# Patient Record
Sex: Female | Born: 1970 | Race: White | Hispanic: No | Marital: Married | Smoking: Current every day smoker
Health system: Southern US, Community
[De-identification: ages and names within clinical notes are randomized; demographics above are authoritative.]

## PROBLEM LIST (undated history)

## (undated) DIAGNOSIS — K589 Irritable bowel syndrome without diarrhea: Secondary | ICD-10-CM

## (undated) DIAGNOSIS — E039 Hypothyroidism, unspecified: Secondary | ICD-10-CM

## (undated) DIAGNOSIS — L0591 Pilonidal cyst without abscess: Secondary | ICD-10-CM

## (undated) DIAGNOSIS — E7800 Pure hypercholesterolemia, unspecified: Secondary | ICD-10-CM

## (undated) DIAGNOSIS — E669 Obesity, unspecified: Secondary | ICD-10-CM

## (undated) DIAGNOSIS — R002 Palpitations: Secondary | ICD-10-CM

## (undated) HISTORY — DX: Palpitations: R00.2

## (undated) HISTORY — DX: Hypothyroidism, unspecified: E03.9

## (undated) HISTORY — DX: Pilonidal cyst without abscess: L05.91

## (undated) HISTORY — PX: MICRODISCECTOMY LUMBAR: SUR864

## (undated) HISTORY — DX: Irritable bowel syndrome, unspecified: K58.9

## (undated) HISTORY — DX: Obesity, unspecified: E66.9

## (undated) HISTORY — DX: Pure hypercholesterolemia, unspecified: E78.00

---

## 1998-03-23 ENCOUNTER — Ambulatory Visit (HOSPITAL_COMMUNITY): Admission: RE | Admit: 1998-03-23 | Discharge: 1998-03-23 | Payer: Self-pay | Admitting: Family Medicine

## 1998-05-08 ENCOUNTER — Observation Stay (HOSPITAL_COMMUNITY): Admission: AD | Admit: 1998-05-08 | Discharge: 1998-05-08 | Payer: Self-pay | Admitting: Obstetrics and Gynecology

## 1998-05-15 ENCOUNTER — Observation Stay (HOSPITAL_COMMUNITY): Admission: AD | Admit: 1998-05-15 | Discharge: 1998-05-15 | Payer: Self-pay | Admitting: Obstetrics and Gynecology

## 1998-11-07 ENCOUNTER — Inpatient Hospital Stay (HOSPITAL_COMMUNITY): Admission: AD | Admit: 1998-11-07 | Discharge: 1998-11-07 | Payer: Self-pay | Admitting: Obstetrics and Gynecology

## 1998-11-24 ENCOUNTER — Inpatient Hospital Stay (HOSPITAL_COMMUNITY): Admission: AD | Admit: 1998-11-24 | Discharge: 1998-11-28 | Payer: Self-pay | Admitting: Obstetrics & Gynecology

## 1999-01-03 ENCOUNTER — Other Ambulatory Visit: Admission: RE | Admit: 1999-01-03 | Discharge: 1999-01-03 | Payer: Self-pay | Admitting: Obstetrics and Gynecology

## 1999-12-26 ENCOUNTER — Other Ambulatory Visit: Admission: RE | Admit: 1999-12-26 | Discharge: 1999-12-26 | Payer: Self-pay | Admitting: Obstetrics and Gynecology

## 2000-06-27 ENCOUNTER — Encounter: Payer: Self-pay | Admitting: Obstetrics and Gynecology

## 2000-06-27 ENCOUNTER — Inpatient Hospital Stay (HOSPITAL_COMMUNITY): Admission: AD | Admit: 2000-06-27 | Discharge: 2000-06-30 | Payer: Self-pay | Admitting: Obstetrics and Gynecology

## 2000-06-28 ENCOUNTER — Encounter: Payer: Self-pay | Admitting: Obstetrics and Gynecology

## 2000-07-31 ENCOUNTER — Encounter: Payer: Self-pay | Admitting: Internal Medicine

## 2000-07-31 ENCOUNTER — Emergency Department (HOSPITAL_COMMUNITY): Admission: EM | Admit: 2000-07-31 | Discharge: 2000-07-31 | Payer: Self-pay | Admitting: Emergency Medicine

## 2000-08-28 ENCOUNTER — Other Ambulatory Visit: Admission: RE | Admit: 2000-08-28 | Discharge: 2000-08-28 | Payer: Self-pay | Admitting: Obstetrics and Gynecology

## 2001-07-13 ENCOUNTER — Other Ambulatory Visit: Admission: RE | Admit: 2001-07-13 | Discharge: 2001-07-13 | Payer: Self-pay | Admitting: Obstetrics and Gynecology

## 2001-09-04 ENCOUNTER — Inpatient Hospital Stay (HOSPITAL_COMMUNITY): Admission: AD | Admit: 2001-09-04 | Discharge: 2001-09-04 | Payer: Self-pay | Admitting: *Deleted

## 2001-09-04 ENCOUNTER — Encounter: Payer: Self-pay | Admitting: Obstetrics and Gynecology

## 2002-01-27 ENCOUNTER — Inpatient Hospital Stay (HOSPITAL_COMMUNITY): Admission: AD | Admit: 2002-01-27 | Discharge: 2002-01-30 | Payer: Self-pay | Admitting: Obstetrics and Gynecology

## 2002-03-15 ENCOUNTER — Other Ambulatory Visit: Admission: RE | Admit: 2002-03-15 | Discharge: 2002-03-15 | Payer: Self-pay | Admitting: Obstetrics and Gynecology

## 2002-08-31 ENCOUNTER — Encounter: Admission: RE | Admit: 2002-08-31 | Discharge: 2002-08-31 | Payer: Self-pay | Admitting: Rheumatology

## 2002-08-31 ENCOUNTER — Encounter: Payer: Self-pay | Admitting: Rheumatology

## 2003-03-22 ENCOUNTER — Other Ambulatory Visit: Admission: RE | Admit: 2003-03-22 | Discharge: 2003-03-22 | Payer: Self-pay | Admitting: Obstetrics and Gynecology

## 2004-08-01 ENCOUNTER — Other Ambulatory Visit: Admission: RE | Admit: 2004-08-01 | Discharge: 2004-08-01 | Payer: Self-pay | Admitting: Obstetrics and Gynecology

## 2008-10-20 ENCOUNTER — Emergency Department (HOSPITAL_COMMUNITY): Admission: EM | Admit: 2008-10-20 | Discharge: 2008-10-20 | Payer: Self-pay | Admitting: Emergency Medicine

## 2009-01-16 ENCOUNTER — Emergency Department (HOSPITAL_COMMUNITY): Admission: EM | Admit: 2009-01-16 | Discharge: 2009-01-16 | Payer: Self-pay | Admitting: Emergency Medicine

## 2009-01-16 ENCOUNTER — Ambulatory Visit: Payer: Self-pay | Admitting: Internal Medicine

## 2009-02-02 ENCOUNTER — Ambulatory Visit: Payer: Self-pay

## 2009-02-02 ENCOUNTER — Encounter: Payer: Self-pay | Admitting: Internal Medicine

## 2009-02-08 DIAGNOSIS — R002 Palpitations: Secondary | ICD-10-CM

## 2009-02-08 DIAGNOSIS — F17200 Nicotine dependence, unspecified, uncomplicated: Secondary | ICD-10-CM

## 2009-02-09 ENCOUNTER — Ambulatory Visit: Payer: Self-pay | Admitting: Internal Medicine

## 2009-02-15 ENCOUNTER — Telehealth: Payer: Self-pay | Admitting: Internal Medicine

## 2011-01-09 LAB — POCT PREGNANCY, URINE: Preg Test, Ur: NEGATIVE

## 2011-01-09 LAB — CBC
HCT: 46.8 % — ABNORMAL HIGH (ref 36.0–46.0)
Hemoglobin: 16.1 g/dL — ABNORMAL HIGH (ref 12.0–15.0)
MCHC: 34.3 g/dL (ref 30.0–36.0)
MCV: 90.7 fL (ref 78.0–100.0)
RBC: 5.16 MIL/uL — ABNORMAL HIGH (ref 3.87–5.11)
WBC: 12.8 10*3/uL — ABNORMAL HIGH (ref 4.0–10.5)

## 2011-01-09 LAB — URINALYSIS, ROUTINE W REFLEX MICROSCOPIC
Glucose, UA: NEGATIVE mg/dL
Ketones, ur: 15 mg/dL — AB
Nitrite: NEGATIVE
Protein, ur: NEGATIVE mg/dL
pH: 6 (ref 5.0–8.0)

## 2011-01-09 LAB — DIFFERENTIAL
Basophils Absolute: 0.2 10*3/uL — ABNORMAL HIGH (ref 0.0–0.1)
Eosinophils Relative: 1 % (ref 0–5)
Lymphocytes Relative: 30 % (ref 12–46)
Lymphs Abs: 3.9 10*3/uL (ref 0.7–4.0)
Neutrophils Relative %: 63 % (ref 43–77)

## 2011-01-09 LAB — COMPREHENSIVE METABOLIC PANEL
BUN: 11 mg/dL (ref 6–23)
CO2: 23 mEq/L (ref 19–32)
Calcium: 9.4 mg/dL (ref 8.4–10.5)
Chloride: 104 mEq/L (ref 96–112)
Creatinine, Ser: 0.8 mg/dL (ref 0.4–1.2)
GFR calc Af Amer: >60 mL/min (ref >60)
GFR calc non Af Amer: >60 mL/min (ref >60)
Glucose, Bld: 120 mg/dL — ABNORMAL HIGH (ref 70–99)
Total Bilirubin: 1 mg/dL (ref 0.3–1.2)

## 2011-01-09 LAB — POCT CARDIAC MARKERS
CKMB, poc: <1 ng/mL — ABNORMAL LOW (ref 1.0–8.0)
Myoglobin, poc: 42.2 ng/mL (ref 12–200)

## 2011-02-12 NOTE — Consult Note (Signed)
NAMEALYSIA, Rebekah Flores NO.:  [id number]   MEDICAL RECORD NO.:  [id number]          Rebekah Flores TYPE:  EMS   LOCATION:  ED                           FACILITY:  [facility]   PHYSICIAN:  Pricilla Riffle, MD, FACCDATE OF BIRTH:  1971/05/26   DATE OF CONSULTATION:  DATE OF DISCHARGE:  01/16/2009                                 CONSULTATION   PRIMARY CARDIOLOGISTS:  New--Dr. Gunnar Fusi Ross/EP Dr. Lewayne Bunting   PRIMARY CARE PHYSICIAN:  None.   OBSTETRICS/GYNECOLOGY:  Dr. Candice Camp   Rebekah Flores is a 40 year old Caucasian female with no known history of  coronary artery disease who presented to [facility] Emergency Room this  admission complaining of palpitations and rapid heartbeat.  Rebekah Flores  states this has happened on 3 other occasions.  Note:  Those 3 occasions  all occurred during normal pregnancies.  She had 1 syncopal episode  associated with the palpitations about 4 years ago.  The other 3 times,  she felt presyncopal but did not have a frank syncopal episode.  On this  admission, Rebekah Flores states she was in her usual state of health, went  to bed last night without problems.  Around 4:00 a.m., she woke up.  She  felt her heart was racing.  She got up to urinate.  She said she felt  very faint, returned to bed.  She was restless, unable to sleep.  Heart  continued to beat extremely fast.  She then became short of breath and  diaphoretic and states she felt like she was going to pass out again.  So, she decided to come in and get evaluated.  In the ER, 12-lead EKG  showed SVT at a rate of 149.  ER physician attempted a vagal maneuver  which was successful, and Rebekah Flores converted to a normal sinus rhythm and  has maintained sinus rhythm on the monitor while here in the emergency  room.  Urine pregnancy screen has also been obtained that was negative.  Rebekah Flores denies any increased caffeine intake or decongestant use.  She  essentially has no past medical history other than  the palpitations and  rapid heartbeat that also have occurred 3 previous times while pregnant.  She states the only person she has ever seen is her OB/GYN.   PAST MEDICAL HISTORY:  Palpitations, tobacco abuse.  Rebekah Flores has been  smoking for about 6 years.  She states she quit prior to that but just  picked the habit back up.   SOCIAL HISTORY:  She lives in [place] with her spouse.  She is a  [employer].  They have 3 children, all healthy.  She smokes 1/2-  pack of cigarettes a day.  No diet restrictions.  No drug, herbal  medication or alcohol use.  No routine exercise.   FAMILY HISTORY:  Parents are alive and well.  Rebekah Flores thinks her father  has been diagnosed with an arrhythmia, but details are unavailable.  She  has siblings alive and well.   REVIEW OF SYSTEMS:  Positive for sweats, shortness of breath,  palpitations, presyncopal episode, all  described in HPI.  All other  systems were reviewed and are negative.   ALLERGIES:  No known drug allergies.   MEDICINES:  No routine prescribed or over-the-counter medicines.   PHYSICAL EXAMINATION:  VITAL SIGNS:  Temp 97.8, heart rate 80,  respirations 16, blood pressure 117/69, saturating 100% on room air.  GENERALLY:  In no acute distress.  HEENT:  Unremarkable.  NECK:  Supple without lymphadenopathy, bruits or JVD.  CARDIOVASCULAR:  Reveals S1, S2, regular rate and rhythm.  I do not hear  any murmurs, rubs, or gallops.  LUNGS:  Clear to auscultation bilaterally.  ABDOMEN:  Soft, nontender, positive bowel sounds.  LOWER EXTREMITIES:  Without clubbing, cyanosis or edema.  NEUROLOGICAL:  Alert and oriented x3.   Chest x-ray showed no acute findings.  EKG:  Currently sinus rhythm at a  rate of 86, previously sinus tachycardia at a rate of 149 without acute  ST or T-wave changes.  Point-of-care is negative x1.  Hemoglobin and  hematocrit 16.1 and 46.8 respectively, potassium 4.1, creatinine 0.8.   IMPRESSION:  1.  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  2. Tobacco abuse.   Dr. Dietrich Pates has been in to examine and assess Rebekah Flores.  Rebekah Flores has  been educated on the symptoms associated with SVT and precautions to  take in regards to syncope.  I scheduled her to follow up with a 2-D  echocardiogram January 24, 2009, at 4:00 p.m. and then an evaluation with  Dr. Ladona Ridgel Feb 09, 2009, at 3:30 p.m.  Rebekah Flores is interested in pursuing  an ablation.  We will check a TSH here prior to leaving the ER.  Otherwise, follow up outpatient as instructed.      Dorian Pod, ACNP      Pricilla Riffle, MD, [facility]  Electronically Signed    MB/MEDQ  D:  01/16/2009  T:  01/16/2009  Job:  [id number]

## 2011-02-15 NOTE — H&P (Signed)
[facility]  Patient:    Rebekah Flores, Rebekah Flores                  MRN: [id number] Adm. Date:  [id number] Attending:  Frederich Balding                         History and Physical  HISTORY:                      The patient is a 40 year old gravida 2, para 1, married white female with estimated date of confinement of July 09, 2000, giving her estimated gestational age of 38-1/2 weeks.  This is consistent with an ultrasound evaluation.  Her prenatal course has been complicated by a prior low transverse cesarean section with failure to progress.  She was scheduled for a repeat cesarean section next Friday.  She presented to triage complaining of the onset of a rapid heart rate and dizziness since this evening.  Of note, she had a similar episode with her last pregnancy that was never followed up after delivery.  On initial evaluation in triage, the patient was in a supraventricular tachycardia with a rate of 169.  She subsequently did convert spontaneously to a normal sinus rhythm.  An arterial blood gas was obtained with a PO2 on room air of 67.8.  Subsequent chest x-ray was read out as normal.  A cardiology consultation was obtained.  An echocardiogram was performed which did reveal some hypokinesia of the left ventricular wall.  There was some concern about this finding.  Cardiac enzymes were obtained and are still pending at the present time.  It was the recommendation that possibly proceeding with delivery at this time was in order, in order that further evaluation of the patients cardiac status could be undertaken.  In view of this, we are going to proceed with a repeat cesarean section at this point in time.  ALLERGIES:                    No known drug allergies.  CURRENT MEDICATIONS:          Prenatal vitamins.  PRENATAL LABORATORY DATA:     The patient is A-positive, negative antibody screen, nonreactive serology.  She is immune to rubella.   Negative hepatitis-B surface antigen.  She does have a positive group-B Streptococcus with the prior pregnancy.  Her 50 g glucola was 90.  PAST MEDICAL HISTORY/FAMILY HISTORY/SOCIAL HISTORY:  Please see the prenatal records.  REVIEW OF SYSTEMS:            Noncontributory.  PHYSICAL EXAMINATION:  VITAL SIGNS:                  The patient is afebrile with stable vital signs.  HEENT:                        Normocephalic.  Pupils equal, round, reactive to light and accommodation.  Extraocular movements intact.  Sclerae and conjunctivae clear.  Oropharynx clear.  NECK:                         Without thyromegaly.  BREASTS:                      Were not examined.  LUNGS:  Clear at this point in time.  CARDIOVASCULAR:               A regular rhythm and rate.  There was grade 2/6 systolic ejection murmur.  No clicks or gallops at this point.  She was in a normal sinus rhythm.  ABDOMEN:                      Gravid uterus consistent with dates.  Fetal heart tones audible.  Fetal monitoring revealed a reactive tracing, no decelerations.  PELVIC:                       Not performed at this point in time.  NEUROLOGIC:                   Deep tendon reflexes 2+ with no clonus.  EXTREMITIES:                  She had a trace of edema.  IMPRESSION:                   1. Intrauterine pregnancy at 38-1/2 weeks with                                  prior cesarean section, desires repeat.                               2. Episode of supraventricular tachycardia of                                  undetermined etiology.                               3. Arterial blood gas indicating mild hypoxia                                  of undetermined etiology.                               4. Echocardiogram suggestive of hypokinesia                                  of the left ventricular wall.  PLAN:                         In view of these findings, the patient will proceed with  a repeat cesarean section at this point in time.  The risks were discussed, including the risks of infection, the risks of hemorrhage that could necessitate transfusion, the risks of AIDS or hepatitis.  The risks of injury to adjacent organs, including bladder, bowel, or ureters, that could require further exploratory surgery, the risks of deep vein thrombosis and pulmonary embolus.  The patient expressed her understanding of the indications and risks.  The patients group-B Streptococcus was positive.  Because her membranes are not ruptured and she is not in active labor, will not treat empirically preoperatively. DD:  06/27/00 TD:  06/27/00 Job:  [id number]

## 2011-02-15 NOTE — Op Note (Signed)
[facility]  Patient:    Rebekah Flores, Rebekah Flores                        MRN: [id number] Proc. Date: 06/27/00 Attending:  Juluis Mire, M.D.                           Operative Report  PREOPERATIVE DIAGNOSES:         1. Intrauterine pregnancy at 38+ weeks.                                 2. Prior cesarean section; desires repeat.                                 3. Maternal supraventricular tachycardia and                                    hypoxia, possible cardiac origin.  POSTOPERATIVE DIAGNOSES:        1. Intrauterine pregnancy at 38+ weeks.                                 2. Prior cesarean section; desires repeat.                                 3. Maternal supraventricular tachycardia and                                    hypoxia, possible cardiac origin. OPERATION:                      Repeat low transverse cesarean section.  SURGEON:                        Juluis Mire, M.D.  ANESTHESIA:                     Spinal.  ESTIMATED BLOOD LOSS:           800 cc.  PACKS AND DRAINS:               None.  INTRAOPERATIVE BLOOD PLACED:    None.  COMPLICATIONS:                  None.  INDICATIONS:                    Indications are dictated in the history and physical.  DESCRIPTION OF PROCEDURE:       The patient was taken to the operating room and placed in the supine position with left lateral tilt.  After a satisfactory level of spinal anesthesia was obtained, the abdomen was then prepped out with Betadine and draped in a sterile field.  The prior low transverse incision was identified and incised.  The incision was then extended to the subcutaneous tissue.  The anterior rectus fascia was entered sharply and incision in the fascia was extended laterally.  The fascia was taken off the  muscle superiorly and inferiorly using both blunt and sharp dissection.  Rectus muscles were then separated in the midline.  The anterior peritoneum was identified and  entered sharply.  The incision in the peritoneum was extended both superiorly and inferiorly.  The low transverse bladder flap was developed.  The low transverse uterine incision was begun with the knife and extended using manual traction.  The infant presented in the vertex presentation and was delivered with the elevation of the head and fundal pressure.  The infant was a live female who weighed 7 pounds ounces.  Apgars were 8 and 9.  Umbilical cord pH was 7.29.  The placenta was then delivered manually.  The uterus was wiped free of remaining membranes and placenta.  The uterus was then closed with a running locking suture of 0 chromic using a two layer closure technique.  We had excellent hemostasis.  Urine output was clear and adequate.  Tubes and ovaries were unremarkable.  At this point in time, the muscles were reapproximated with running suture of 3-0 Vicryl.  The fascia was closed with a running suture of 0 PDS.  The subcutaneous tissue was closed with a running suture of 3-0 Vicryl.  The skin was closed with staples and Steri-Strips.  Sponge, instrument and needle counts reported as correct by the circulating nurse x 2.  Foley catheter remained clear at the time of closure. The patient tolerated the procedure well and returned to the recovery room in good condition. DD:  06/27/00 TD:  06/28/00 Job: [id number]

## 2011-02-15 NOTE — Discharge Summary (Signed)
[facility]  Patient:    Rebekah Flores, Rebekah Flores                  MRN: [id number] Adm. Date:  [id number] Disc. Date: [id number] Attending:  Frederich Balding Dictator:   Danie Chandler, R.N.                           Discharge Summary  ADMISSION DIAGNOSES:          1. Intrauterine pregnancy at 38+ weeks                                  gestation.                               2. Prior cesarean section, desires repeat.                               3. Maternal supraventricular tachycardia                                  and hypoxia, possible cardiac origin.  DISCHARGE DIAGNOSES:          1. Intrauterine pregnancy at 38+ weeks                                  gestation.                               2. Prior cesarean section, desires repeat.                               3. Maternal supraventricular tachycardia                                  and hypoxia, possible cardiac origin.  PROCEDURE:                    On June 27, 2000, a repeat low transverse cesarean section.  REASON FOR ADMISSION:         See the dictated H&P.  HOSPITAL COURSE:              The patient was taken to the operating room and underwent the above-named procedure without complications.  This was productive of a viable female infant with Apgars of 8 at one minute and 9 at five minutes.  An arterial cord pH of 7.29.  Postoperatively the patient did well.  On postoperative day number one the patients vital signs were stable. Her lungs were clear.  Her spiral CT was negative.  Her hemoglobin was 9.5. She was doing well. On postoperative day number two, the patient had a good return of bowel function and was tolerating a regular diet.  She was also ambulating well without difficulty and had good pain control.  DISPOSITION:                  She was discharged home on postoperative day  number three.  CONDITION ON DISCHARGE:       Good.  DIET:                         Regular as  tolerated.  ACTIVITIES:                   No heavy lifting, no driving, no vaginal entry.  FOLLOWUP:                     She is to follow up in the office on Friday for staple removal.  She is to call for a temperature greater than 100 degrees, persistent nausea, vomiting, heavy vaginal bleeding and/or redness or drainage from the incision site.  She is to follow up with her cardiologist, Dr. Luis Abed.  DISCHARGE MEDICATIONS:        1. Prenatal vitamins one p.o. q.d.                               2. Tylox #30, one to two p.o. q.3-4h.                                  p.r.n. pain.                               3. Ibuprofen 600 mg p.o. q.6h. p.r.n. pain. DD:  07/23/00 TD:  07/23/00 Job: [id number]

## 2011-02-15 NOTE — Op Note (Signed)
[facility] of [facility]  Patient:    Rebekah Flores, Rebekah Flores Visit Number: [id number] MRN: [id number]          Service Type: OBS Location: 910A 9132 01 Attending Physician:  Trevor Iha Dictated by:   Trevor Iha, M.D. Proc. Date: 01/27/02 Admit Date:  01/27/2002                             Operative Report  PREOPERATIVE DIAGNOSES:       1. Previous cesarean section x2.                               2. Desire for repeat cesarean section.                               3. Intrauterine pregnancy at 39 weeks.  POSTOPERATIVE DIAGNOSES:      1. Previous cesarean section x2.                               2. Desire for repeat cesarean section.                               3. Intrauterine pregnancy at 39 weeks.  PROCEDURE:                    Repeat low segment transverse cesarean section  SURGEON:                      Trevor Iha, M.D.  ASSISTANT:                    Guy Sandifer. Arleta Creek, M.D.  ANESTHESIA:                   Spinal.  ESTIMATED BLOOD LOSS:         800 cc.  INDICATIONS:                  The patient is a 40 year old G3, P2 at 39 weeks with an uncomplicated pregnancy and two previous cesarean sections for CPD. She desire repeat cesarean section.  Estimated date of confinement is Feb 04, 2002.  She presents today for repeat cesarean section.  The risks and benefits were discussed and informed consent was obtained.  FINDINGS:                     Viable female infant.  Apgars 9 and 9.  Weight 8 pounds 4 ounces.  Arterial pH 7.28.  DESCRIPTION OF PROCEDURE:     After adequate analgesia, the patient was placed in the supine position with a left lateral tilt.  She was sterilely prepped and draped.  A Foley catheter was sterilely placed.  A Pfannenstiel skin incision was made two fingerbreadths above the pubic symphysis.  This was taken down sharply.  The fascia was incised transversely and extended superiorly and inferior off the bellies of  the rectus muscles, which were separated sharply in the midline.  The peritoneum was entered sharply.  A bladder blade was placed.  The uterine serosa was elevated, nicked and incised transversely.  A bladder flap was created and replaced behind the bladder  blade.  A low segment myotomy incision was made down to the infants vertex, which was delivered atraumatically.  The nares and pharynx were then suctioned.  The remainder of the infant was then delivered with good cry noted.  The cord was clamped and cut and the infant was handed to the pediatricians for resuscitation.  Cord blood was then obtained.  The placenta was extracted manually.  The uterus was exteriorized and wiped clean with a dry lap.  The myotomy incision was closed in two layers, the first being a running locking layer of 0 Monocryl, the second being an imbricating layer of 0 Monocryl.  Normal uterus, tubes and ovaries were noted.  The uterus was placed back in the peritoneal cavity and, after a copious amount of irrigation, adequate hemostasis was assured.  The peritoneum was closed with 0 Monocryl.  The rectus muscles were plicated in the midline.  Irrigation was applied.  After adequate hemostasis, the fascia was closed in a single layer of 0 PDS in a running fashion.  Irrigation was applied.  After adequate hemostasis, the skin was stapled and Steri-Strips applied.  The patient tolerated the procedure well and was stable on transfer to the recovery room. Sponge, needle and instrument counts was normal x3.  The patient received 1 g of cefotetan after delivery of the placenta. Dictated by:   Trevor Iha, M.D. Attending Physician:  Trevor Iha DD:  01/27/02 TD:  01/27/02 Job: [id number]

## 2011-02-15 NOTE — Discharge Summary (Signed)
[facility]  Patient:    Rebekah Flores, Rebekah Flores Visit Number: [id number] MRN: [id number]          Service Type: OBS Location: 910A 9132 01 Attending Physician:  Trevor Iha Dictated by:   Julio Sicks, N.P. Admit Date:  01/27/2002 Discharge Date: 01/30/2002                             Discharge Summary  ADMISSION DIAGNOSES:          1. Intrauterine pregnancy at 39 weeks.                               2. Previous cesarean delivery x2.                               3. Desires repeat cesarean delivery.  DISCHARGE DIAGNOSES:          1. Low transverse cesarean section.                               2. Viable female infant.  PROCEDURE:                    Repeat low transverse cesarean section.  REASON FOR ADMISSION:         Please see dictated H&P.  HOSPITAL COURSE:              The patient was admitted for a repeat cesarean delivery.  The patient was taken to the operating room for the above named procedure.  A low transverse incision was made without complications and the delivery of a viable female infant weighing 8 pounds 4 ounces with Apgars of 9 at one minute and 9 at five minutes.  Arterial cord pH was 7.28.  The patient tolerated the procedure well and was taken to the recovery room.  On postoperative day #1, the patient had good return of bowel function.  Abdomen was soft, fundus was firm.  Abdominal dressing was clean, dry, and intact. Foley was discontinued and the patient was voiding without difficulty.  Labs revealed a hemoglobin of 8.7, hematocrit 25.8, WBC count of 7.8.  On postoperative day #2, incision was clean, dry, and intact.  The patient was ambulating without difficulty.  She was tolerating a regular diet without complaints of nausea and vomiting.  On postoperative day #3, the patient was doing well.  Staples were removed and the patient was discharged home.  CONDITION ON DISCHARGE:       Good.  DIET:                          Regular as tolerated.  ACTIVITY:                     No heavy lifting, no driving x2 weeks, no vaginal entry.  FOLLOW-UP:                    The patient is to follow up in the office in one to two weeks for an incision check.  She is to call for a temperature greater than 100 degrees, persistent nausea and vomiting, heavy vaginal bleeding, and/or redness or drainage from the incision site.  DISCHARGE  MEDICATIONS:        1. Percocet 5/325 one every four to six hours                                  p.r.n. pain.                               2. Ibuprofen 600 mg over-the-counter every six                                  hours.                               3. Niferex 150 mg one p.o. daily.                               4. Prenatal vitamins one p.o. daily. Dictated by:   Julio Sicks, N.P. Attending Physician:  Trevor Iha DD:  02/16/02 TD:  02/17/02 Job: [id number]

## 2011-02-15 NOTE — H&P (Signed)
[facility]  Patient:    Rebekah Flores, Rebekah Flores Visit Number: [id number] MRN: [id number]          Service Type: Attending:  Trevor Iha, M.D. Dictated by:   Trevor Iha, M.D. Adm. Date:  01/27/02                           History and Physical  HISTORY OF PRESENT ILLNESS:   Ms. Warzecha is a 40 year old, G3, P2, at 74 weeks who presents for repeat cesarean section.  Estimated date of confinement is Feb 04, 2002.  She has had two previous cesarean sections with desired repeat.  Her pregnancy has been uncomplicated.  Her blood type is A positive.  HISTORY:                      The patient has a history of paternal supraventricular tachycardia, remote history of anxiety disorder.  PAST SURGICAL HISTORY:        She has had two cesarean sections and also back surgery.  PAST OBSTETRICAL HISTORY:     Again, she has had two cesarean sections, the first for pelvic disproportion; the second was repeat.  MEDICATIONS:                  Prenatal vitamins.  ALLERGIES:                    No known drug allergies.  PHYSICAL EXAMINATION:  VITAL SIGNS:                  Blood pressure 120/70.  HEART:                        Regular rate and rhythm.  LUNGS:                        Clear to auscultation bilaterally.  ABDOMEN:                      Gravid, nontender.  PELVIC:                       Cervix closed, thick, and high.  IMPRESSION:                   1. Intrauterine pregnancy at 39 weeks.                               2. Previous cesarean section x 2.                               3. Desire for repeat cesarean section.  PLAN:                         Repeat low transverse cesarean section.  Risks and benefits were discussed at length, and informed consent was obtained. Dictated by:   Trevor Iha, M.D. Attending:  Trevor Iha, M.D. DD:  01/26/02 TD:  01/26/02 Job: [id number]

## 2012-07-01 ENCOUNTER — Other Ambulatory Visit: Payer: Self-pay | Admitting: Obstetrics and Gynecology

## 2012-07-01 DIAGNOSIS — R928 Other abnormal and inconclusive findings on diagnostic imaging of breast: Secondary | ICD-10-CM

## 2012-07-01 DIAGNOSIS — N6489 Other specified disorders of breast: Secondary | ICD-10-CM

## 2012-07-06 ENCOUNTER — Ambulatory Visit
Admission: RE | Admit: 2012-07-06 | Discharge: 2012-07-06 | Disposition: A | Discharge status: Home / Self Care | Payer: Commercial Indemnity | Source: Ambulatory Visit | Attending: Obstetrics and Gynecology | Admitting: Obstetrics and Gynecology

## 2012-07-06 DIAGNOSIS — R928 Other abnormal and inconclusive findings on diagnostic imaging of breast: Secondary | ICD-10-CM

## 2013-08-01 ENCOUNTER — Encounter (HOSPITAL_COMMUNITY): Payer: Self-pay | Admitting: Emergency Medicine

## 2013-08-01 ENCOUNTER — Emergency Department (HOSPITAL_COMMUNITY)
Admission: EM | Admit: 2013-08-01 | Discharge: 2013-08-01 | Disposition: A | Discharge status: Home / Self Care | Payer: Managed Care, Other (non HMO) | Attending: Emergency Medicine | Admitting: Emergency Medicine

## 2013-08-01 DIAGNOSIS — M5416 Radiculopathy, lumbar region: Secondary | ICD-10-CM

## 2013-08-01 DIAGNOSIS — R209 Unspecified disturbances of skin sensation: Secondary | ICD-10-CM | POA: Insufficient documentation

## 2013-08-01 DIAGNOSIS — IMO0002 Reserved for concepts with insufficient information to code with codable children: Secondary | ICD-10-CM | POA: Insufficient documentation

## 2013-08-01 MED ORDER — HYDROCODONE-ACETAMINOPHEN 5-325 MG PO TABS: 2.0000 | ORAL_TABLET | ORAL | Status: DC | PRN

## 2013-08-01 NOTE — ED Notes (Signed)
Unsure why pt has not been triaged out of the system. Pt was moved off the floor 0930

## 2013-08-01 NOTE — ED Notes (Signed)
Pt from home reports that she has hx of back pain and back surgery that worsened yesterday. Pt states that she has numbness to L leg. Pt is A&O and in NAD

## 2013-08-01 NOTE — ED Provider Notes (Signed)
CSN: [id number]     Arrival date & time 08/01/13  9562 History   None    No chief complaint on file.  (Consider location/radiation/quality/duration/timing/severity/associated sxs/prior Treatment) HPI Complains of low back pain radiating to left foot onset yesterday when she bent over to pick up an object inside the trunk of her car. Pain is worse with sitting improved with standing. No loss of bladder or bowel control. No fever. Associated symptoms include some numbness in her left foot No other complaint. Treated with ibuprofen without adequate pain relief. Pain moderate at present. No past medical history on file. past medical history negative No past surgical history on file. No family history on file. surgical history microdiscectomy of lumbar spine History  Substance Use Topics  . Smoking status: Not on file  . Smokeless tobacco: Not on file  . Alcohol Use: Not on file   social history positive smoker no alcohol no drug use  OB History   No data available     Review of Systems  Constitutional: Negative.   HENT: Negative.   Respiratory: Negative.   Cardiovascular: Negative.   Gastrointestinal: Negative.   Musculoskeletal: Positive for back pain.  Skin: Negative.   Neurological: Positive for numbness.  Psychiatric/Behavioral: Negative.   All other systems reviewed and are negative.    Allergies  Review of patient's allergies indicates not on file.  Home Medications  No current outpatient prescriptions on file. BP 122/76  Pulse 102  Temp(Src) 97.7 F (36.5 C) (Oral)  Resp 16  SpO2 100% Physical Exam  Nursing note and vitals reviewed. Constitutional: She appears well-developed and well-nourished.  HENT:  Head: Normocephalic and atraumatic.  Eyes: Conjunctivae are normal. Pupils are equal, round, and reactive to light.  Neck: Neck supple. No tracheal deviation present. No thyromegaly present.  Cardiovascular: Regular rhythm.   Pulmonary/Chest: Effort normal and  breath sounds normal.  Abdominal: Soft. Bowel sounds are normal. She exhibits no distension. There is no tenderness.  Musculoskeletal: Normal range of motion. She exhibits no edema and no tenderness.  Mild left paralumbar tenderness thoracic spine is nontender. No swelling. Pain is exacerbated when she flexes at the waist.  Neurological: She is alert. She has normal reflexes. Coordination normal.  Gait is normal. There dorsiflex left great toe without difficulty. DTRs symmetric bilaterally at knee jerk and ankle jerk bilaterally motor strength 5 over 5 overall  Skin: Skin is warm and dry. No rash noted.  Psychiatric: She has a normal mood and affect.    ED Course  Procedures (including critical care time) Labs Review Labs Reviewed - No data to display Imaging Review No results found.  EKG Interpretation   None       MDM  No diagnosis found. Acute imaging is not indicated. Patient agreesPlan prescription Norco. opiodpain medicine not givenis driving. Patient to followup with Dr. Rana Snare if significant pain one week Diagnosis lumbar radiculopathy    Doug Sou, MD 08/01/13 [phone]

## 2015-12-17 ENCOUNTER — Emergency Department (HOSPITAL_COMMUNITY)
Admission: EM | Admit: 2015-12-17 | Discharge: 2015-12-17 | Disposition: A | Discharge status: Home / Self Care | Payer: Managed Care, Other (non HMO) | Attending: Emergency Medicine | Admitting: Emergency Medicine

## 2015-12-17 ENCOUNTER — Encounter (HOSPITAL_COMMUNITY): Payer: Self-pay | Admitting: *Deleted

## 2015-12-17 ENCOUNTER — Emergency Department (HOSPITAL_COMMUNITY): Payer: Managed Care, Other (non HMO)

## 2015-12-17 DIAGNOSIS — J029 Acute pharyngitis, unspecified: Secondary | ICD-10-CM | POA: Diagnosis not present

## 2015-12-17 DIAGNOSIS — R5383 Other fatigue: Secondary | ICD-10-CM | POA: Diagnosis not present

## 2015-12-17 DIAGNOSIS — M791 Myalgia: Secondary | ICD-10-CM | POA: Insufficient documentation

## 2015-12-17 DIAGNOSIS — R0981 Nasal congestion: Secondary | ICD-10-CM | POA: Diagnosis not present

## 2015-12-17 DIAGNOSIS — R059 Cough, unspecified: Secondary | ICD-10-CM

## 2015-12-17 DIAGNOSIS — R509 Fever, unspecified: Secondary | ICD-10-CM | POA: Insufficient documentation

## 2015-12-17 DIAGNOSIS — R05 Cough: Secondary | ICD-10-CM | POA: Diagnosis not present

## 2015-12-17 DIAGNOSIS — R0989 Other specified symptoms and signs involving the circulatory and respiratory systems: Secondary | ICD-10-CM | POA: Insufficient documentation

## 2015-12-17 DIAGNOSIS — R110 Nausea: Secondary | ICD-10-CM | POA: Diagnosis not present

## 2015-12-17 MED ORDER — BENZONATATE 100 MG PO CAPS: 200.0000 mg | ORAL_CAPSULE | Freq: Two times a day (BID) | ORAL | Status: DC | PRN

## 2015-12-17 MED ORDER — OXYMETAZOLINE HCL 0.05 % NA SOLN: 1.0000 | Freq: Two times a day (BID) | NASAL | Status: DC

## 2015-12-17 NOTE — ED Notes (Signed)
Pt states that she has had fever, body aches, chills and generalized malaise since Wednesday; pt states that she has had nausea with no vomiting; pt c/o congestion and states "I think I am wheezing" at times; pt states that she has been taking tylenol and ibuprofen for the fever and body aches and that it has helped; pt states "I think I have the flu, my husband had it last week"

## 2015-12-17 NOTE — ED Provider Notes (Signed)
CSN: [id number]     Arrival date & time 12/17/15  0555 History   First MD Initiated Contact with Patient 12/17/15 [phone]     Chief Complaint  Patient presents with  . Flu Like Symptoms      (Consider location/radiation/quality/duration/timing/severity/associated sxs/prior Treatment) HPI   Patient is a 45 year old female with no pertinent past medical history who presents to the ED with complaint of flulike symptoms, onset 5 days. Patient reports having fever (99-100.5), chills, body aches, fatigue, nasal congestion, sore throat, nonproductive cough, chest congestion and mild nausea. Denies headache, rhinorrhea, shortness of breath, wheezing, chest pain, abdominal pain, nausea, vomiting, diarrhea, urinary symptoms, rash. She notes she has been taking ibuprofen at home intermittently with mild relief of her fever and body aches. She notes her husband has been sick at home with similar symptoms over the past week. Patient denies taking any other medications at home. Patient endorses smoking half a pack per day but notes she is only been smoking a few cigarettes daily since her symptoms started.  History reviewed. No pertinent past medical history. Past Surgical History  Procedure Laterality Date  . Cesarean section    . Microdiscectomy lumbar  L5   No family history on file. Social History  Substance Use Topics  . Smoking status: Current Every Day Smoker -- 0.50 packs/day    Types: Cigarettes  . Smokeless tobacco: None  . Alcohol Use: No   OB History    No data available     Review of Systems  Constitutional: Positive for fever, chills and fatigue.  HENT: Positive for congestion and sore throat.   Respiratory: Positive for cough.   Gastrointestinal: Positive for nausea.  Musculoskeletal: Positive for myalgias (generalized body aches).  All other systems reviewed and are negative.     Allergies  Review of patient's allergies indicates no known allergies.  Home Medications    Prior to Admission medications   Medication Sig Start Date End Date Taking? Authorizing Provider  benzonatate (TESSALON) 100 MG capsule Take 2 capsules (200 mg total) by mouth 2 (two) times daily as needed for cough. 12/17/15   Barrett HenleNicole Elizabeth Myrna Vonseggern, PA-C  HYDROcodone-acetaminophen [facility](NORCO) 5-325 MG per tablet Take 2 tablets by mouth every 4 (four) hours as needed for pain. 08/01/13   Doug SouSam Jacubowitz, MD  ibuprofen (ADVIL,MOTRIN) 200 MG tablet Take 800 mg by mouth every 6 (six) hours as needed for pain.    Historical Provider, MD  oxymetazoline (AFRIN NASAL SPRAY) 0.05 % nasal spray Place 1 spray into both nostrils 2 (two) times daily. 12/17/15   Satira SarkNicole Elizabeth Kiffany Schelling, PA-C   BP 119/70 mmHg  Pulse 92  Temp(Src) 98.3 F (36.8 C) (Oral)  Resp 18  SpO2 96%  LMP 11/30/2015 Physical Exam  Constitutional: She is oriented to person, place, and time. She appears well-developed and well-nourished.  HENT:  Head: Normocephalic and atraumatic.  Right Ear: Tympanic membrane normal.  Left Ear: Tympanic membrane normal.  Nose: Nose normal. Right sinus exhibits no maxillary sinus tenderness and no frontal sinus tenderness. Left sinus exhibits no maxillary sinus tenderness and no frontal sinus tenderness.  Mouth/Throat: Uvula is midline, oropharynx is clear and moist and mucous membranes are normal. No oropharyngeal exudate, posterior oropharyngeal edema, posterior oropharyngeal erythema or tonsillar abscesses.  Eyes: Conjunctivae and EOM are normal. Pupils are equal, round, and reactive to light. Right eye exhibits no discharge. Left eye exhibits no discharge. No scleral icterus.  Neck: Normal range of motion. Neck supple.  Cardiovascular: Normal  rate, regular rhythm, normal heart sounds and intact distal pulses.   Pulmonary/Chest: Effort normal. No respiratory distress. She has no wheezes. She has rales (mild rales noted in lower lobes). She exhibits no tenderness.  Abdominal: Soft. Bowel sounds are  normal. She exhibits no distension and no mass. There is no tenderness. There is no rebound and no guarding.  Musculoskeletal: Normal range of motion. She exhibits no edema.  Lymphadenopathy:    She has no cervical adenopathy.  Neurological: She is alert and oriented to person, place, and time.  Skin: Skin is warm and dry. She is not diaphoretic.  Nursing note and vitals reviewed.   ED Course  Procedures (including critical care time) Labs Review Labs Reviewed - No data to display  Imaging Review Dg Chest 2 View  12/17/2015  CLINICAL DATA:  Cough and fever.  Shortness of breath for 4 days EXAM: CHEST  2 VIEW COMPARISON:  January 16, 2009 FINDINGS: Lungs are clear. Heart size and pulmonary vascularity are normal. No adenopathy. No bone lesions. IMPRESSION: No edema or consolidation. Electronically Signed   By: Bretta Bang III M.D.   On: 12/17/2015 08:18   I have personally reviewed and evaluated these images and lab results as part of my medical decision-making.   EKG Interpretation None      MDM   Final diagnoses:  Fever, unspecified fever cause  Cough  Sore throat  Nasal congestion    Patient with symptoms consistent with influenza.  Vitals are stable, low-grade fever.  No signs of dehydration, tolerating PO's.  Lungs are clear. CXR negative.  Discussed the cost versus benefit of Tamiflu treatment with the patient.  The patient understands that symptoms are greater than the recommended 24-48 hour window of treatment.  Patient will be discharged with instructions to orally hydrate, rest, and use over-the-counter medications such as anti-inflammatories ibuprofen and Aleve for muscle aches and Tylenol for fever.  Patient will also be given a cough suppressant and decongestant.      Dany Harten [place], [place] 12/17/15 [id number]  Donnetta Hutching, MD 12/17/15 [phone]

## 2015-12-17 NOTE — Discharge Instructions (Signed)
Take your medications as prescribed. I recommend continuing to take Tylenol and ibuprofen as prescribed over-the-counter as seen for fever and body aches. Continue drinking six 8 ounce glasses of water and/or Gatorade daily to remain hydrated. Please follow up with a primary care provider from the Resource Guide provided below in 4-5 days as needed. Please return to the Emergency Department if symptoms worsen or new onset of uncontrollable fever, shortness of breath, chest pain, vomiting, unable to keep fluids down, abdominal pain.

## 2016-01-25 ENCOUNTER — Ambulatory Visit (INDEPENDENT_AMBULATORY_CARE_PROVIDER_SITE_OTHER): Payer: Managed Care, Other (non HMO) | Admitting: Physician Assistant

## 2016-01-25 ENCOUNTER — Encounter: Payer: Self-pay | Admitting: Physician Assistant

## 2016-01-25 VITALS — BP 108/60 | HR 91 | Temp 98.3°F | Ht 65.5 in | Wt 179.0 lb

## 2016-01-25 DIAGNOSIS — R0981 Nasal congestion: Secondary | ICD-10-CM | POA: Diagnosis not present

## 2016-01-25 DIAGNOSIS — J029 Acute pharyngitis, unspecified: Secondary | ICD-10-CM

## 2016-01-25 LAB — POCT RAPID STREP A (OFFICE): RAPID STREP A SCREEN: NEGATIVE

## 2016-01-25 MED ORDER — IPRATROPIUM BROMIDE 0.03 % NA SOLN: 2.0000 | Freq: Two times a day (BID) | NASAL | Status: DC

## 2016-01-25 MED ORDER — MAGIC MOUTHWASH W/LIDOCAINE: 10.0000 mL | ORAL | Status: DC | PRN

## 2016-01-25 NOTE — Patient Instructions (Addendum)
Get plenty of rest and drink at least 64 ounces of water a day.  Use mouth wash to help with sore throat. The Atrovent is a nasal spray that will help with the congestion.  Use OTC Mucinex to also help with congestion.  May take Ibuprofen for fever PRN.    IF you received an x-ray today, you will [facility]. Please contact [facility] at [phone] with questions or concerns regarding your invoice.   IF you received labwork today, you will receive an invoice from [facility]. Please contact [facility] at [phone] with questions or concerns regarding your invoice.   Our billing staff will not be able to assist you with questions regarding bills from these companies.  You will be contacted with the lab results as soon as they are available. The fastest way to get your results is to activate your My Chart account. Instructions are located on the last page of this paperwork. If you have not heard from us regarding the results in 2 weeks, please contact this office.

## 2016-01-25 NOTE — Progress Notes (Signed)
Patient ID: Rebekah GrillNicole A Flores, female     DOB: [birth year], 45 y.o.    MRN: [id number]  PCP: No PCP Per Patient  Chief Complaint  Patient presents with  . swollen tonsils    x1 day  . Sore Throat  . Headache  . Fever    at home 100.6 last pm.    Subjective:    HPI  Presents for evaluation of 1 day history of sore throat, headache and fever.  Patient states her sore throat started yesterday. She feels like her "tonsils are swollen". And it hurts to swallow. Patient took her temperature at home last night and it was 100.6. She took 3 ibuprofen to help with the fever and sore throat.  She also endorses headache and sinus congestion. Mild intermittant cough that is non productive. Denies any rhinorrhea and otalgia.   Patient recently recovered from the flu approx 4 weeks ago. She states her son has a "cold" and she was in the car with him last weekend while visiting colleges. Her husband also complains of sore throat and fever. She denies any seasonal allergies. She has tried no other medications.     Prior to Admission medications   Medication Sig Start Date End Date Taking? Authorizing Provider  ibuprofen (ADVIL,MOTRIN) 200 MG tablet Take 800 mg by mouth every 6 (six) hours as needed for pain.   Yes Historical Provider, MD  naproxen sodium (ANAPROX) 550 MG tablet TAKE 1 TABLET EVERY 8 HOURS AS NEEDED FOR PAIN. 11/19/15   Historical Provider, MD     No Known Allergies   Patient Active Problem List   Diagnosis Date Noted  . TOBACCO ABUSE 02/08/2009  . PALPITATIONS 02/08/2009     No family history on file.   Social History   Social History  . Marital Status: Married    Spouse Name: N/A  . Number of Children: N/A  . Years of Education: N/A   Occupational History  . Not on file.   Social History Main Topics  . Smoking status: Current Every Day Smoker -- 0.50 packs/day    Types: Cigarettes  . Smokeless tobacco: Not on file  . Alcohol Use: No  . Drug Use:  Not on file  . Sexual Activity: Not on file   Other Topics Concern  . Not on file   Social History Narrative        Review of Systems Constitutional: Positive for fever and chills.  HENT: Positive for congestion, postnasal drip, sinus pressure and sore throat. Negative for ear pain, rhinorrhea and sneezing.  Eyes: Negative.  Respiratory: Positive for cough. Negative for shortness of breath.  Cardiovascular: Negative for chest pain and palpitations.  Gastrointestinal: Negative for nausea, vomiting, abdominal pain and diarrhea.  Genitourinary: Negative.  Musculoskeletal: Negative for myalgias.  Neurological: Positive for headaches. Negative for dizziness, syncope, weakness and light-headedness.         Objective:  Physical Exam  Constitutional: She is oriented to person, place, and time. She appears well-developed and well-nourished. No distress.  BP 108/60 mmHg  Pulse 91  Temp(Src) 98.3 F (36.8 C) (Oral)  Ht 5' 5.5" (1.664 m)  Wt 179 lb (81.194 kg)  BMI 29.32 kg/m2  SpO2 98%  LMP 01/05/2016 (Approximate)   HENT:  Head: Normocephalic and atraumatic.  Right Ear: Hearing, tympanic membrane, external ear and ear canal normal.  Left Ear: Hearing, tympanic membrane, external ear and ear canal normal.  Nose: Mucosal edema (mild, R>L, dark pink turbinates) present. No  rhinorrhea.  No foreign bodies. Right sinus exhibits no maxillary sinus tenderness and no frontal sinus tenderness. Left sinus exhibits no maxillary sinus tenderness and no frontal sinus tenderness.  Mouth/Throat: Uvula is midline and mucous membranes are normal. No oral lesions. No uvula swelling. Posterior oropharyngeal edema (minimal) and posterior oropharyngeal erythema (mild) present. No oropharyngeal exudate or tonsillar abscesses.  Eyes: Conjunctivae and EOM are normal. Pupils are equal, round, and reactive to light. Right eye exhibits no discharge. Left eye exhibits no discharge. No scleral icterus.    Neck: Trachea normal, normal range of motion, full passive range of motion without pain and phonation normal. Neck supple. No thyroid mass and no thyromegaly present.  Cardiovascular: Normal rate, regular rhythm and normal heart sounds.   Pulmonary/Chest: Effort normal and breath sounds normal.  Lymphadenopathy:       Head (right side): No submandibular, no tonsillar, no preauricular, no posterior auricular and no occipital adenopathy present.       Head (left side): No submandibular, no tonsillar, no preauricular and no occipital adenopathy present.    She has no cervical adenopathy.       Right: No supraclavicular adenopathy present.       Left: No supraclavicular adenopathy present.  While there are no palpable nodes, the area of the tonsillar nodes is full and tender.  Neurological: She is alert and oriented to person, place, and time. She has normal strength. No cranial nerve deficit or sensory deficit.  Skin: Skin is warm, dry and intact. No rash noted.  Psychiatric: She has a normal mood and affect. Her speech is normal and behavior is normal.         Results for orders placed or performed in visit on 01/25/16  POCT rapid strep A  Result Value Ref Range   Rapid Strep A Screen Negative Negative       Assessment & Plan:  1. Sore throat Likely viral URI. Supportive care.  Anticipatory guidance.  RTC if symptoms worsen/persist. - POCT rapid strep A - magic mouthwash w/lidocaine SOLN; Take 10 mLs by mouth every 2 (two) hours as needed for mouth pain.  Dispense: 360 mL; Refill: 0 - Culture, Group A Strep - ipratropium (ATROVENT) 0.03 % nasal spray; Place 2 sprays into both nostrils 2 (two) times daily.  Dispense: 30 mL; Refill: 0   Fernande Bras, PA-C Physician Assistant-Certified [facility]

## 2016-01-25 NOTE — Progress Notes (Signed)
Subjective:    Patient ID: Rebekah GrillNicole A Flores, female    DOB: 15-Aug-1971, 45 y.o.   MRN: [id number]  Chief Complaint  Patient presents with  . swollen tonsils    x1 day  . Sore Throat  . Headache  . Fever    at home 100.6 last pm.    HPI Patient presents for 1 day history of sore throat, headache and fever.  Patient states her sore throat started yesterday. She feels like her "tonsils are swollen". And it hurts to swallow. Patient took her temperature at home last night and it was 100.6. She took 3 ibuprofen to help with the fever and sore throat.  She also endorses headache and sinus congestion. Mild intermittant cough that is non productive. Denies any rhinorrhea and otalgia.    Patient recently recovered from the flu approx 4 weeks ago. She states her son has a "cold" and she was in the car with him last weekend while visiting colleges. Her husband also complains of sore throat and fever. She denies any seasonal allergies. She has tried no other medications.  Current Outpatient Prescriptions on File Prior to Visit  Medication Sig Dispense Refill  . ibuprofen (ADVIL,MOTRIN) 200 MG tablet Take 800 mg by mouth every 6 (six) hours as needed for pain.     No current facility-administered medications on file prior to visit.   No Known Allergies   Review of Systems  Constitutional: Positive for fever and chills.  HENT: Positive for congestion, postnasal drip, sinus pressure and sore throat. Negative for ear pain, rhinorrhea and sneezing.   Eyes: Negative.   Respiratory: Positive for cough. Negative for shortness of breath.   Cardiovascular: Negative for chest pain and palpitations.  Gastrointestinal: Negative for nausea, vomiting, abdominal pain and diarrhea.  Genitourinary: Negative.   Musculoskeletal: Negative for myalgias.  Neurological: Positive for headaches. Negative for dizziness, syncope, weakness and light-headedness.       Objective:   Physical Exam    Constitutional: She is oriented to person, place, and time. She appears well-developed and well-nourished. No distress.  HENT:  Head: Normocephalic and atraumatic.  Right Ear: Tympanic membrane, external ear and ear canal normal.  Left Ear: Tympanic membrane, external ear and ear canal normal.  Nose: Mucosal edema (Right turbinate erythematous and edematous, left turbiinate normal) present. No rhinorrhea. Right sinus exhibits no maxillary sinus tenderness and no frontal sinus tenderness. Left sinus exhibits no maxillary sinus tenderness and no frontal sinus tenderness.  Mouth/Throat: Mucous membranes are normal. Posterior oropharyngeal edema (Mild) and posterior oropharyngeal erythema (Mild) present. No oropharyngeal exudate.  Neck: Normal range of motion. Neck supple.  Cardiovascular: Normal rate, regular rhythm, normal heart sounds and intact distal pulses.   Pulmonary/Chest: Effort normal and breath sounds normal.  Lymphadenopathy:    She has no cervical adenopathy (Fullness but no cervical adenopathy).  Neurological: She is alert and oriented to person, place, and time.  Skin: Skin is warm and dry.  Psychiatric: She has a normal mood and affect. Her behavior is normal. Judgment and thought content normal.          Assessment & Plan:  1. Sore throat Likely viral pharyngitis. Rapid strep negative. Treat symptomatically. - POCT rapid strep A - magic mouthwash w/lidocaine SOLN; Take 10 mLs by mouth every 2 (two) hours as needed for mouth pain.  Dispense: 360 mL; Refill: 0  2. Sinus congestion Likely viral uri with post nasal drip. Treat symptomatically. - ipratropium (ATROVENT) 0.03 % nasal spray;  Place 2 sprays into both nostrils 2 (two) times daily.  Dispense: 30 mL; Refill: 0  Azucena Kuba PA-S 01/25/2016

## 2016-01-26 LAB — CULTURE, GROUP A STREP: Organism ID, Bacteria: NORMAL

## 2017-12-15 ENCOUNTER — Other Ambulatory Visit: Payer: Self-pay | Admitting: Gastroenterology

## 2017-12-15 DIAGNOSIS — R109 Unspecified abdominal pain: Secondary | ICD-10-CM

## 2017-12-25 ENCOUNTER — Ambulatory Visit
Admission: RE | Admit: 2017-12-25 | Discharge: 2017-12-25 | Disposition: A | Discharge status: Home / Self Care | Payer: BLUE CROSS/BLUE SHIELD | Source: Ambulatory Visit | Attending: Gastroenterology | Admitting: Gastroenterology

## 2017-12-25 DIAGNOSIS — R109 Unspecified abdominal pain: Secondary | ICD-10-CM

## 2017-12-25 MED ORDER — IOPAMIDOL (ISOVUE-300) INJECTION 61%
100.0000 mL | Freq: Once | INTRAVENOUS | Status: AC | PRN
  Administered 2017-12-25: 125 mL via INTRAVENOUS

## 2019-10-01 HISTORY — PX: OTHER SURGICAL HISTORY: SHX169

## 2019-10-01 HISTORY — PX: CARPAL TUNNEL RELEASE: SHX101

## 2020-09-30 HISTORY — PX: JOINT REPLACEMENT: SHX530

## 2021-08-20 ENCOUNTER — Other Ambulatory Visit: Payer: Self-pay | Admitting: Obstetrics and Gynecology

## 2021-08-20 DIAGNOSIS — R928 Other abnormal and inconclusive findings on diagnostic imaging of breast: Secondary | ICD-10-CM

## 2021-09-19 ENCOUNTER — Ambulatory Visit
Admission: RE | Admit: 2021-09-19 | Discharge: 2021-09-19 | Disposition: A | Discharge status: Home / Self Care | Payer: BLUE CROSS/BLUE SHIELD | Source: Ambulatory Visit | Attending: Obstetrics and Gynecology | Admitting: Obstetrics and Gynecology

## 2021-09-19 ENCOUNTER — Ambulatory Visit
Admission: RE | Admit: 2021-09-19 | Discharge: 2021-09-19 | Disposition: A | Discharge status: Home / Self Care | Payer: 59 | Source: Ambulatory Visit | Attending: Obstetrics and Gynecology | Admitting: Obstetrics and Gynecology

## 2021-09-19 DIAGNOSIS — R928 Other abnormal and inconclusive findings on diagnostic imaging of breast: Secondary | ICD-10-CM

## 2022-10-17 ENCOUNTER — Other Ambulatory Visit: Payer: Self-pay | Admitting: Obstetrics and Gynecology

## 2022-10-17 DIAGNOSIS — R928 Other abnormal and inconclusive findings on diagnostic imaging of breast: Secondary | ICD-10-CM

## 2022-10-21 ENCOUNTER — Other Ambulatory Visit: Payer: Self-pay | Admitting: Obstetrics and Gynecology

## 2022-10-21 ENCOUNTER — Ambulatory Visit
Admission: RE | Admit: 2022-10-21 | Discharge: 2022-10-21 | Disposition: A | Discharge status: Home / Self Care | Payer: 59 | Source: Ambulatory Visit | Attending: Obstetrics and Gynecology | Admitting: Obstetrics and Gynecology

## 2022-10-21 DIAGNOSIS — N6489 Other specified disorders of breast: Secondary | ICD-10-CM

## 2022-10-21 DIAGNOSIS — R928 Other abnormal and inconclusive findings on diagnostic imaging of breast: Secondary | ICD-10-CM

## 2023-03-24 ENCOUNTER — Ambulatory Visit
Admission: RE | Admit: 2023-03-24 | Discharge: 2023-03-24 | Disposition: A | Discharge status: Home / Self Care | Payer: 59 | Source: Ambulatory Visit | Attending: Obstetrics and Gynecology | Admitting: Obstetrics and Gynecology

## 2023-03-24 ENCOUNTER — Ambulatory Visit: Payer: 59

## 2023-03-24 DIAGNOSIS — R928 Other abnormal and inconclusive findings on diagnostic imaging of breast: Secondary | ICD-10-CM

## 2023-03-24 DIAGNOSIS — N6489 Other specified disorders of breast: Secondary | ICD-10-CM

## 2023-04-21 ENCOUNTER — Other Ambulatory Visit: Payer: Self-pay | Admitting: Obstetrics and Gynecology

## 2023-04-21 DIAGNOSIS — R928 Other abnormal and inconclusive findings on diagnostic imaging of breast: Secondary | ICD-10-CM

## 2023-07-10 IMAGING — MG MM DIGITAL DIAGNOSTIC UNILAT*R* W/ TOMO W/ CAD
4 series · 4 of 12 positions shown · non-contrast
Comparison: Previous exam(s).

CLINICAL DATA: Recall from screening mammography, possible mass
involving the LOWER INNER QUADRANT of the RIGHT breast.

Family history of breast cancer in her mother and in maternal aunt.
EXAM:
DIGITAL DIAGNOSTIC UNILATERAL RIGHT MAMMOGRAM WITH TOMOSYNTHESIS AND
CAD; ULTRASOUND RIGHT BREAST LIMITED
TECHNIQUE: Right digital diagnostic mammography and breast tomosynthesis was
performed. The images were evaluated with computer-aided detection.;
Targeted ultrasound examination of the right breast was performed

[R MLO synth-2D]
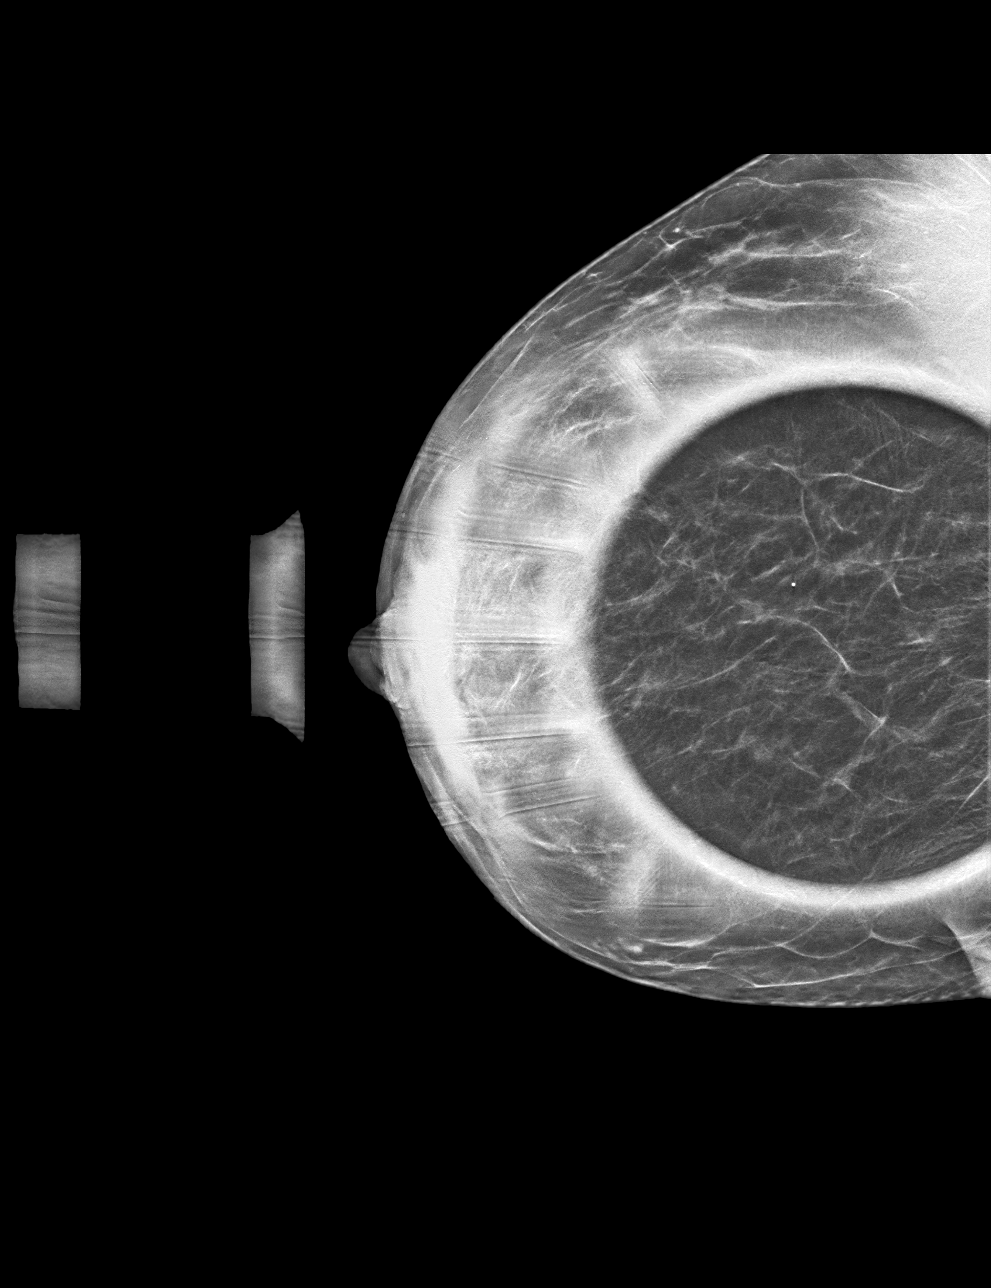

[R CC synth-2D]
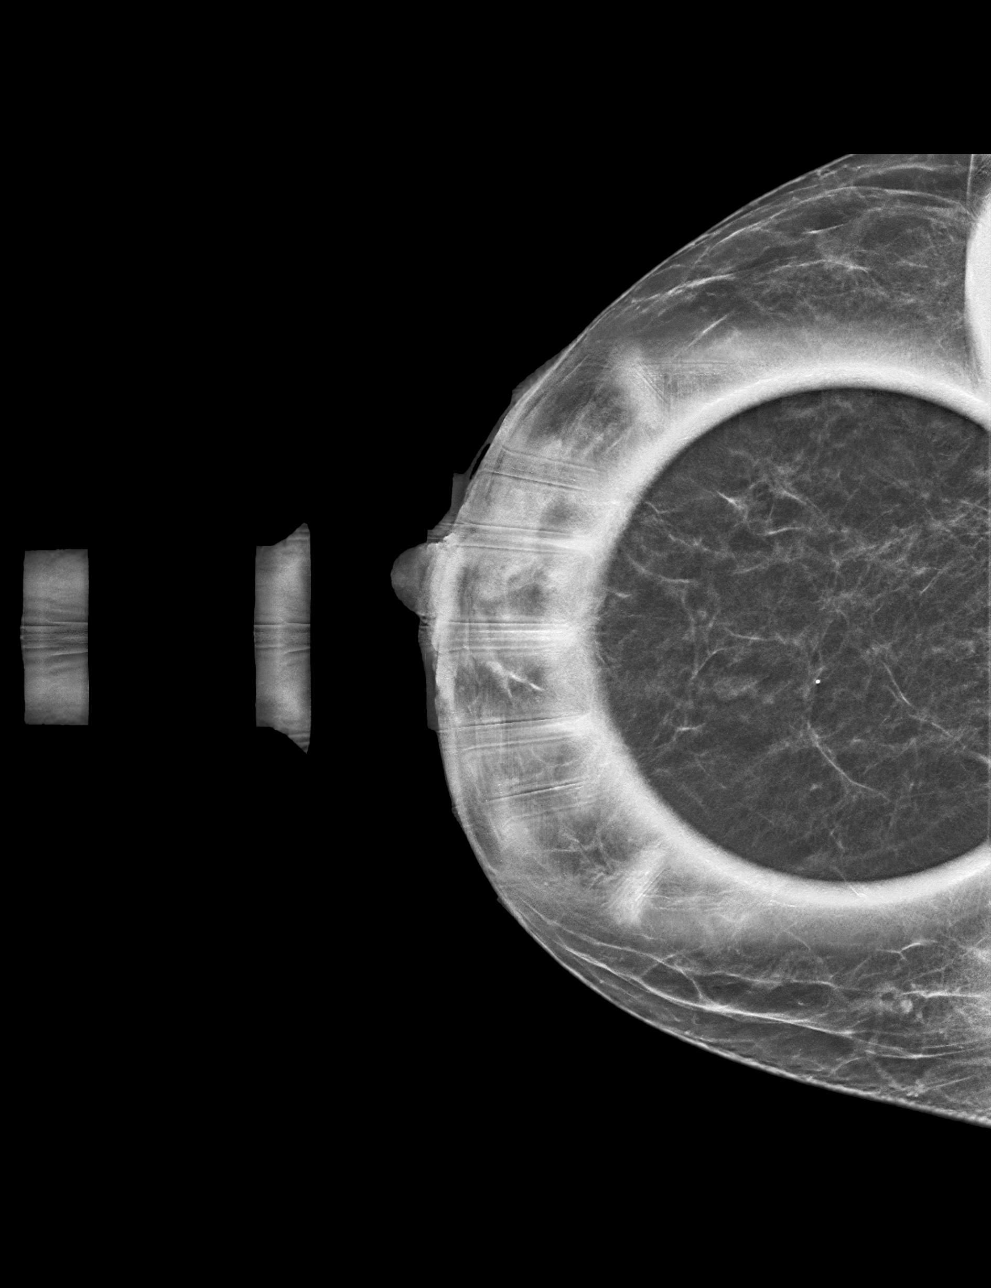

[R CC tomo · tomo slice 23/45.0]
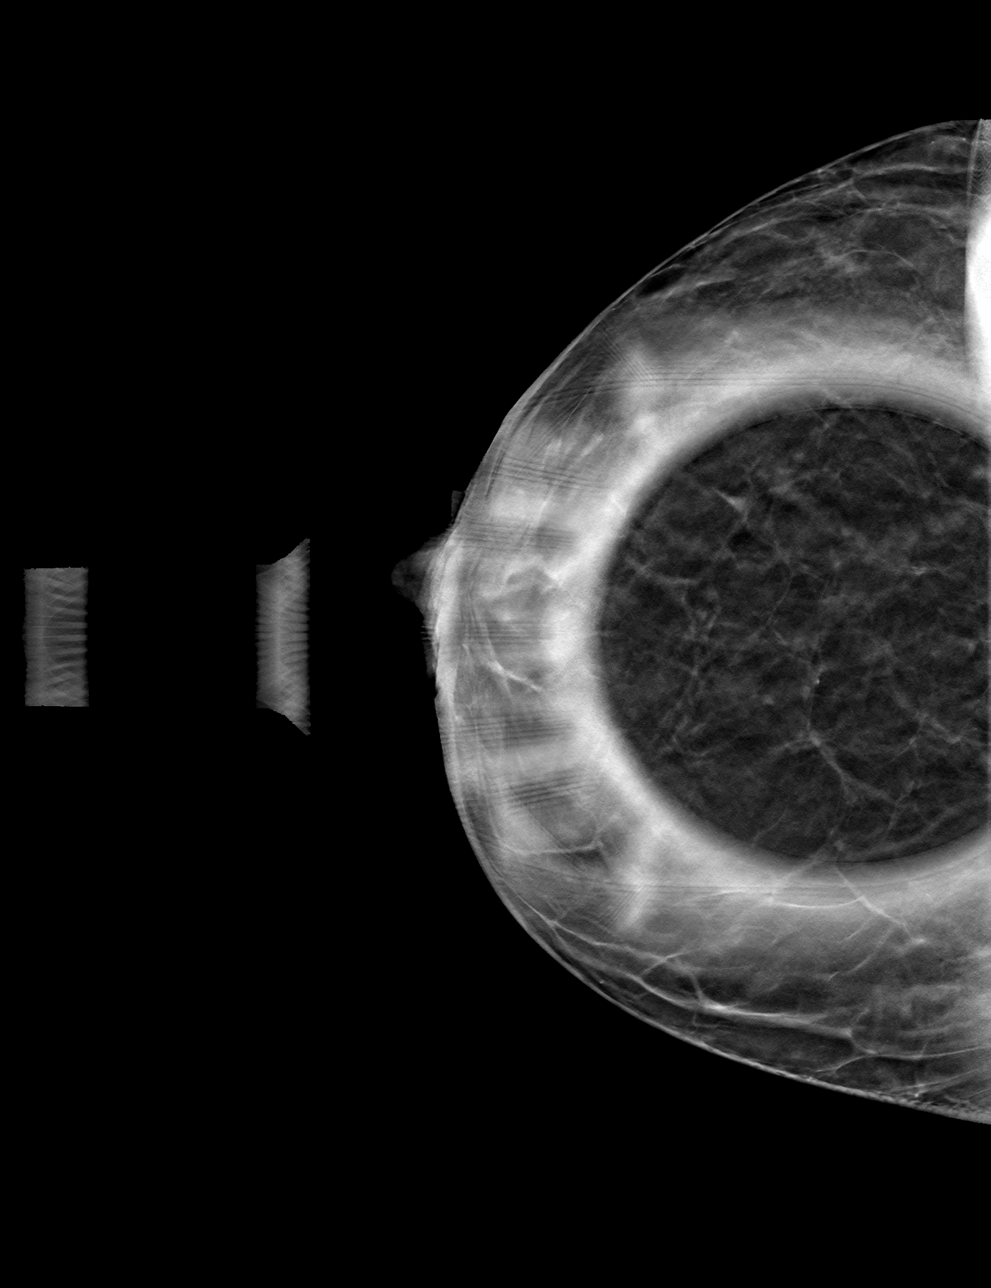

[R MLO tomo · tomo slice 25/49.0]
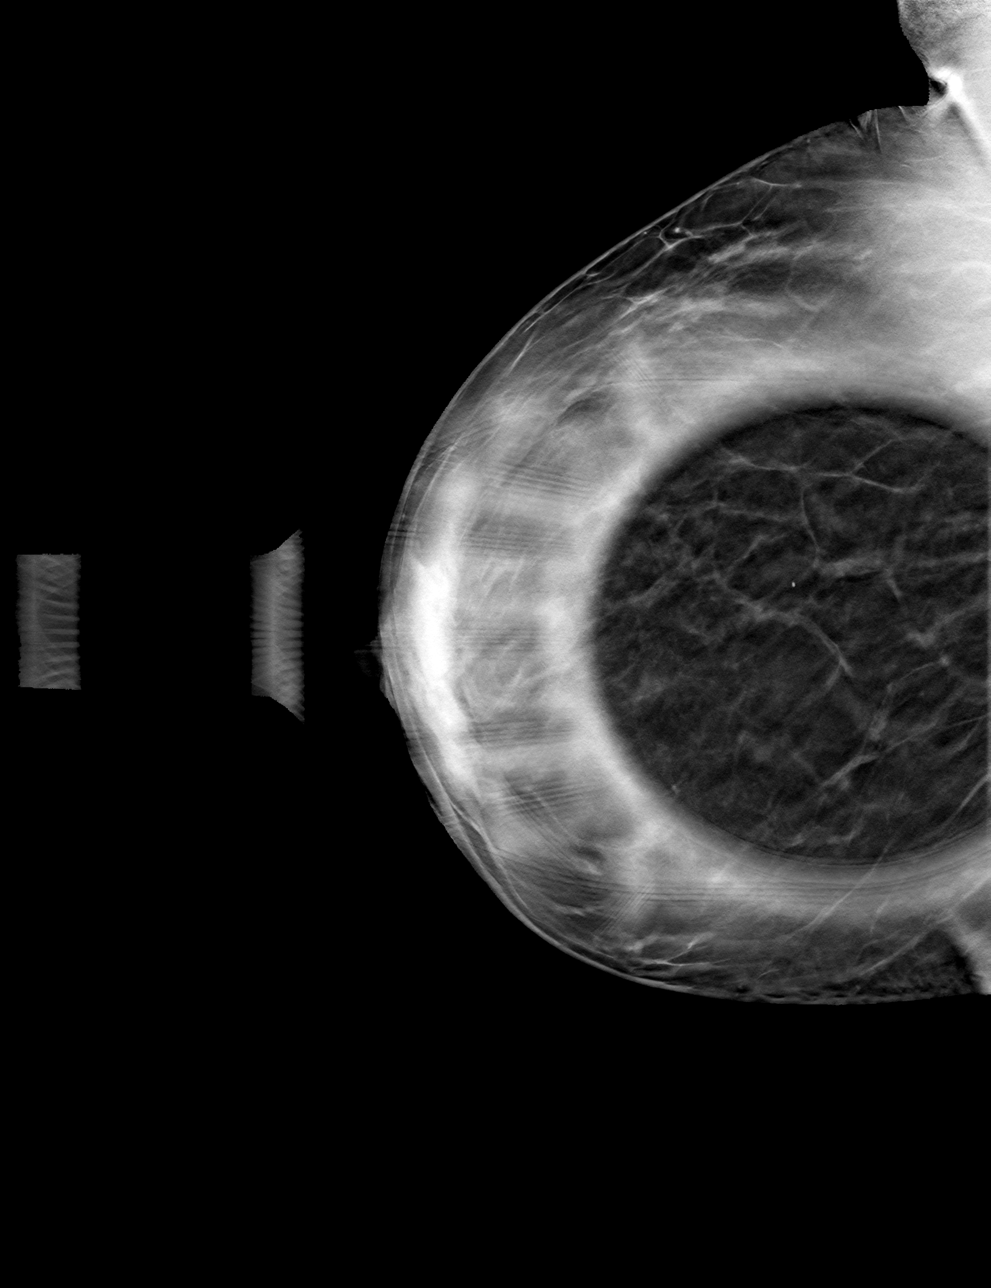

[4 of 12 positions shown; findings below may reference images not displayed]

ACR Breast Density Category b: There are scattered areas of
fibroglandular density.
FINDINGS: Spot compression CC and MLO views of the area of concern were
obtained.

Best seen on spot compression CC images is a fairly superficial
circumscribed isodense mass involving the LOWER INNER QUADRANT
measuring approximately 7-8 mm. There is no associated architectural
distortion or suspicious calcifications.

Targeted ultrasound is performed, demonstrating benign clustered
cysts superficially at the 5 o'clock position 2 cm from the nipple
measuring approximately 7 x 2 x 3 mm, demonstrating posterior
acoustic enhancement and no internal power Doppler flow,
corresponding to the screening mammographic finding. No suspicious
solid mass or abnormal acoustic shadowing is identified.
IMPRESSION: 1. No mammographic or sonographic evidence of malignancy involving
the RIGHT breast.
2. Superficial benign 7 mm clustered cysts in the LOWER INNER
QUADRANT at the 5 o'clock position 2 cm from nipple which accounts
for the screening mammographic.

RECOMMENDATION:
Screening mammogram in one year.(Code:31-G-2DE)

I have discussed the findings and recommendations with the patient.
If applicable, a reminder letter will be sent to the patient
regarding the next appointment.

BI-RADS CATEGORY  2: Benign.

## 2023-10-17 ENCOUNTER — Ambulatory Visit
Admission: RE | Admit: 2023-10-17 | Discharge: 2023-10-17 | Disposition: A | Discharge status: Home / Self Care | Payer: 59 | Source: Ambulatory Visit | Attending: Obstetrics and Gynecology | Admitting: Obstetrics and Gynecology

## 2023-10-17 DIAGNOSIS — R928 Other abnormal and inconclusive findings on diagnostic imaging of breast: Secondary | ICD-10-CM

## 2023-10-20 ENCOUNTER — Other Ambulatory Visit: Payer: Self-pay | Admitting: Obstetrics and Gynecology

## 2023-10-20 DIAGNOSIS — N6489 Other specified disorders of breast: Secondary | ICD-10-CM

## 2024-01-02 NOTE — Progress Notes (Signed)
 Cardiology Office Note:   Date:  01/07/2024  ID:  Rebekah Flores, DOB [birth year], MRN [id number] PCP:  Candice Camp, MD  [facility] Providers Cardiologist:  Alverda Skeans, MD Referring MD: Aliene Beams, MD  Chief Complaint/Reason for Referral: Palpitations ASSESSMENT:    1. Palpitations   2. SVT (supraventricular tachycardia) (HCC)   3. Hyperlipidemia LDL goal <70   4. Aortic atherosclerosis (HCC)     PLAN:   In order of problems listed above: Palpitations: Will obtain  echocardiogram.  Patient's symptoms are quite infrequent happening once every 3 to 4 months.  We will reinstitute metoprolol tartrate 50 mg as needed.  If she develops increasing symptoms we can obtain a monitor.  TSH was within normal limits at the end of February. SVT: Patient has a history of SVT about 24 years ago while she was pregnant.  This was a self-limited episode.  See discussion above.   Hyperlipidemia: By virtue of the presence of aortic atherosclerosis the patient's LDL goal is less than 70.  Start atorvastatin 20 mg and check lipid panel, LFTs, LP(a) in 2 months. Aortic atherosclerosis: Start atorvastatin 20 mg and aspirin 81 mg. Tobacco abuse: Stressed need to abstain from tobacco.  She has no claudication symptoms.            Dispo:  Return in about 1 year (around 01/06/2025).      Medication Adjustments/Labs and Tests Ordered: Current medicines are reviewed at length with the patient today.  Concerns regarding medicines are outlined above.  The following changes have been made:     Labs/tests ordered: Orders Placed This Encounter  Procedures   Lipoprotein A (LPA)   Lipid panel   Hepatic function panel   EKG 12-Lead   ECHOCARDIOGRAM COMPLETE    Medication Changes: Meds ordered this encounter  Medications   aspirin EC 81 MG tablet    Sig: Take 1 tablet (81 mg total) by mouth daily. Swallow whole.   atorvastatin (LIPITOR) 20 MG tablet    Sig: Take 1 tablet (20 mg total) by mouth  daily.    Dispense:  30 tablet    Refill:  3   metoprolol tartrate (LOPRESSOR) 50 MG tablet    Sig: Take 1 tablet (50 mg total) by mouth 2 (two) times daily as needed.    Dispense:  30 tablet    Refill:  3    Current medicines are reviewed at length with the patient today.  The patient does not have concerns regarding medicines.    History of Present Illness:      FOCUSED PROBLEM LIST:   Hypothyroidism BMI 26 On [place] Hyperlipidemia Aortic atherosclerosis CT abdomen pelvis 2019 SVT 2001 while pregnant Tobacco abuse  April 2025:  Patient consents to use of AI scribe. The patient is a 53 year old female referred for evaluation for palpitations.  She had a previous history of SVT with an episode of documented SVT in 2001 which spontaneously converted to normal sinus rhythm while in labor and delivery triage.  The patient was seen by Dr. Ladona Ridgel in 2010 and a beta-blocker was offered.  She was on a metoprolol 50mg  PRN.  Currently, she no longer has the medication but controls episodes by regulating her breathing and bearing down on her chest. Episodes occur infrequently, about once every three to four months, and typically resolve within a minute or two. She feels nervous during episodes but does not experience dizziness or fainting as long as the episode resolves quickly.  She has  been on [facility] for weight management for two years, resulting in significant weight loss. However, a change in insurance led to a two-month hiatus, during which she regained 20 pounds. She has since resumed [facility] and aims to return to her previous weight of 140-145 pounds.  She has a history of hypothyroidism and is on thyroid medication. Her thyroid levels were checked recently and were within normal limits.  She does not smoke and denies claudication symptoms. No dizziness or fainting during SVT episodes as long as they resolve quickly. No pain in her legs when walking.          Current  Medications: Current Meds  Medication Sig   aspirin EC 81 MG tablet Take 1 tablet (81 mg total) by mouth daily. Swallow whole.   atorvastatin (LIPITOR) 20 MG tablet Take 1 tablet (20 mg total) by mouth daily.   metoprolol tartrate (LOPRESSOR) 50 MG tablet Take 1 tablet (50 mg total) by mouth 2 (two) times daily as needed.     Review of Systems:   Please see the history of present illness.    All other systems reviewed and are negative.     EKGs/Labs/Other Test Reviewed:   EKG: EKG from 2010 demonstrates normal sinus rhythm  EKG Interpretation Date/Time:  Wednesday January 07 2024 09:45:53 EDT Ventricular Rate:  81 PR Interval:  114 QRS Duration:  88 QT Interval:  376 QTC Calculation: 436 R Axis:   80  Text Interpretation: Normal sinus rhythm Normal ECG When compared with ECG of 16-Jan-2009 12:03, Vent. rate has decreased BY  68 BPM Confirmed by Alverda Skeans ([id number]) on 01/07/2024 9:52:49 AM         Risk Assessment/Calculations:          Physical Exam:   VS:  BP 116/68   Pulse 81   Ht 5\' 5"  (1.651 m)   Wt 159 lb (72.1 kg)   SpO2 96%   BMI 26.46 kg/m        Wt Readings from Last 3 Encounters:  01/07/24 159 lb (72.1 kg)  01/25/16 179 lb (81.2 kg)      GENERAL:  No apparent distress, AOx3 HEENT:  No carotid bruits, +2 carotid impulses, no scleral icterus CAR: RRR no murmurs, gallops, rubs, or thrills RES:  Clear to auscultation bilaterally ABD:  Soft, nontender, nondistended, positive bowel sounds x 4 VASC:  +2 radial pulses, +2 carotid pulses NEURO:  CN 2-12 grossly intact; motor and sensory grossly intact PSYCH:  No active depression or anxiety EXT:  No edema, ecchymosis, or cyanosis  Signed, Orbie Pyo, MD  01/07/2024 10:25 AM    [facility] [address] [place], [place], [place]  [address] Phone: [phone]; Fax: [phone]   Note:  This document was prepared using Dragon voice recognition software and may include unintentional  dictation errors.

## 2024-01-07 ENCOUNTER — Encounter: Payer: Self-pay | Admitting: Internal Medicine

## 2024-01-07 ENCOUNTER — Ambulatory Visit: Attending: Internal Medicine | Admitting: Internal Medicine

## 2024-01-07 VITALS — BP 116/68 | HR 81 | Ht 65.0 in | Wt 159.0 lb

## 2024-01-07 DIAGNOSIS — I471 Supraventricular tachycardia, unspecified: Secondary | ICD-10-CM | POA: Diagnosis not present

## 2024-01-07 DIAGNOSIS — R002 Palpitations: Secondary | ICD-10-CM

## 2024-01-07 DIAGNOSIS — E785 Hyperlipidemia, unspecified: Secondary | ICD-10-CM

## 2024-01-07 DIAGNOSIS — I700 Atherosclerosis of aorta: Secondary | ICD-10-CM

## 2024-01-07 MED ORDER — ASPIRIN 81 MG PO TBEC: 81.0000 mg | DELAYED_RELEASE_TABLET | Freq: Every day | ORAL | Status: AC

## 2024-01-07 MED ORDER — ATORVASTATIN CALCIUM 20 MG PO TABS: 20.0000 mg | ORAL_TABLET | Freq: Every day | ORAL | 3 refills | Status: AC

## 2024-01-07 MED ORDER — METOPROLOL TARTRATE 50 MG PO TABS: 50.0000 mg | ORAL_TABLET | Freq: Two times a day (BID) | ORAL | 3 refills | Status: DC | PRN

## 2024-01-07 NOTE — Patient Instructions (Addendum)
 Medication Instructions:  Your physician has recommended you make the following change in your medication:   START Aspirin 81 mg once daily  START Atorvastatin (Lipitor) 20 mg once daily   AS NEEDED Metoprolol Tartrate (Lopressor) 50 mg as needed for palpitations   *If you need a refill on your cardiac medications before your next appointment, please call your pharmacy*  Lab Work: To be completed in 2 months: FASTING lipid panel, LFTs, and Lipoprotein A (approximately March 08, 2024)  If you have labs (blood work) drawn today and your tests are completely normal, you will receive your results only by: MyChart Message (if you have MyChart) OR A paper copy in the mail If you have any lab test that is abnormal or we need to change your treatment, we will call you to review the results.  Testing/Procedures: Your physician has requested that you have an echocardiogram. Echocardiography is a painless test that uses sound waves to create images of your heart. It provides your doctor with information about the size and shape of your heart and how well your heart's chambers and valves are working. This procedure takes approximately one hour. There are no restrictions for this procedure. Please do NOT wear cologne, perfume, aftershave, or lotions (deodorant is allowed). Please arrive 15 minutes prior to your appointment time.  Please note: We ask at that you not bring children with you during ultrasound (echo/ vascular) testing. Due to room size and safety concerns, children are not allowed in the ultrasound rooms during exams. Our front office staff cannot provide observation of children in our lobby area while testing is being conducted. An adult accompanying a patient to their appointment will only be allowed in the ultrasound room at the discretion of the ultrasound technician under special circumstances. We apologize for any inconvenience.   Follow-Up: At [facility], you and your health  needs are our priority.  As part of our continuing mission to provide you with exceptional heart care, we have created designated Provider Care Teams.  These Care Teams include your primary Cardiologist (physician) and Advanced Practice Providers (APPs -  Physician Assistants and Nurse Practitioners) who all work together to provide you with the care you need, when you need it.  Your next appointment:   1 year(s)  The format for your next appointment:   In Person  Provider:   One of our Advanced Practice Providers (APPs): Arabella Merles, PA-C  Joni Reining, NP Edd Fabian, NP  Jacolyn Reedy, PA-C Jari Favre, PA-C  Hanley Seamen, PA-C Robin Searing, NP  Azalee Course, PA-C [place], PA-C  Bernadene Person, NP    Ronie Spies, PA-C  Evlyn Clines, PA-C Rise Paganini, NP Tereso Newcomer, PA-C Marjie Skiff, PA-C  [place], NP Yvonna Alanis, PA-C  Perlie Gold, PA-C Robet Leu, PA-C Cyndi Bender, NP  Other Instructions   1st Floor: - Lobby - Registration  - Pharmacy  - Lab - Cafe  2nd Floor: - PV Lab - Diagnostic Testing (echo, CT, nuclear med)  3rd Floor: - Vacant  4th Floor: - TCTS (cardiothoracic surgery) - AFib Clinic - Structural Heart Clinic - Vascular Surgery  - Vascular Ultrasound  5th Floor: - HeartCare Cardiology (general and EP) - Clinical Pharmacy for coumadin, hypertension, lipid, weight-loss medications, and med management appointments    Valet parking services will be available as well.

## 2024-01-08 ENCOUNTER — Other Ambulatory Visit (HOSPITAL_COMMUNITY): Payer: Self-pay | Admitting: Family Medicine

## 2024-01-08 ENCOUNTER — Telehealth: Payer: Self-pay | Admitting: Internal Medicine

## 2024-01-08 DIAGNOSIS — E7800 Pure hypercholesterolemia, unspecified: Secondary | ICD-10-CM

## 2024-01-08 NOTE — Telephone Encounter (Signed)
 Patient wants a copy of her medical records sent to her new PCP, Dr. Aliene Beams.

## 2024-01-15 ENCOUNTER — Ambulatory Visit (HOSPITAL_COMMUNITY)
Admission: RE | Admit: 2024-01-15 | Discharge: 2024-01-15 | Disposition: A | Discharge status: Home / Self Care | Payer: Self-pay | Source: Ambulatory Visit | Attending: Family Medicine | Admitting: Family Medicine

## 2024-01-15 ENCOUNTER — Encounter (HOSPITAL_COMMUNITY): Payer: Self-pay

## 2024-01-15 DIAGNOSIS — E7800 Pure hypercholesterolemia, unspecified: Secondary | ICD-10-CM | POA: Insufficient documentation

## 2024-01-17 ENCOUNTER — Other Ambulatory Visit: Payer: Self-pay | Admitting: Internal Medicine

## 2024-01-17 DIAGNOSIS — R002 Palpitations: Secondary | ICD-10-CM

## 2024-01-21 ENCOUNTER — Other Ambulatory Visit (HOSPITAL_COMMUNITY)

## 2024-02-17 ENCOUNTER — Ambulatory Visit (HOSPITAL_COMMUNITY): Attending: Internal Medicine

## 2024-02-18 ENCOUNTER — Encounter (HOSPITAL_COMMUNITY): Payer: Self-pay | Admitting: Internal Medicine

## 2024-03-31 ENCOUNTER — Ambulatory Visit (HOSPITAL_COMMUNITY)
Admission: RE | Admit: 2024-03-31 | Discharge: 2024-03-31 | Disposition: A | Discharge status: Home / Self Care | Source: Ambulatory Visit | Attending: Cardiology | Admitting: Cardiology

## 2024-03-31 DIAGNOSIS — R008 Other abnormalities of heart beat: Secondary | ICD-10-CM | POA: Diagnosis not present

## 2024-03-31 DIAGNOSIS — R002 Palpitations: Secondary | ICD-10-CM | POA: Insufficient documentation

## 2024-03-31 LAB — ECHOCARDIOGRAM COMPLETE
Area-P 1/2: 4.39 cm2
S' Lateral: 2.83 cm

## 2024-04-03 ENCOUNTER — Ambulatory Visit: Payer: Self-pay | Admitting: Internal Medicine

## 2024-04-20 ENCOUNTER — Inpatient Hospital Stay
Admission: RE | Admit: 2024-04-20 | Discharge: 2024-04-20 | Discharge status: Home / Self Care | Payer: 59 | Source: Ambulatory Visit | Attending: Obstetrics and Gynecology | Admitting: Obstetrics and Gynecology

## 2024-04-20 DIAGNOSIS — N6489 Other specified disorders of breast: Secondary | ICD-10-CM
